# Patient Record
Sex: Female | Born: 1935 | Race: White | Hispanic: No | Marital: Married
Health system: Southern US, Community
[De-identification: ages and names within clinical notes are randomized; demographics above are authoritative.]

---

## 2011-12-23 ENCOUNTER — Ambulatory Visit: Payer: Self-pay | Admitting: Family Medicine

## 2012-02-04 ENCOUNTER — Encounter: Payer: Self-pay | Admitting: Internal Medicine

## 2012-02-18 ENCOUNTER — Encounter: Payer: Self-pay | Admitting: Internal Medicine

## 2012-03-18 ENCOUNTER — Ambulatory Visit: Payer: Self-pay | Admitting: Hematology and Oncology

## 2012-03-18 LAB — CBC CANCER CENTER
Basophil %: 0.1 %
Eosinophil #: 0 x10 3/mm (ref 0.0–0.7)
HCT: 30.9 % — ABNORMAL LOW (ref 35.0–47.0)
HGB: 9.4 g/dL — ABNORMAL LOW (ref 12.0–16.0)
Lymphocyte #: 0.5 x10 3/mm — ABNORMAL LOW (ref 1.0–3.6)
Lymphocyte %: 4.2 %
MCH: 22.9 pg — ABNORMAL LOW (ref 26.0–34.0)
MCHC: 30.5 g/dL — ABNORMAL LOW (ref 32.0–36.0)
Monocyte #: 0.1 x10 3/mm — ABNORMAL LOW (ref 0.2–0.9)
Neutrophil #: 10 x10 3/mm — ABNORMAL HIGH (ref 1.4–6.5)

## 2012-03-20 ENCOUNTER — Ambulatory Visit: Payer: Self-pay | Admitting: Hematology and Oncology

## 2012-03-23 ENCOUNTER — Encounter: Payer: Self-pay | Admitting: Internal Medicine

## 2012-03-25 LAB — BASIC METABOLIC PANEL
Anion Gap: 13 (ref 7–16)
BUN: 22 mg/dL — ABNORMAL HIGH (ref 7–18)
Calcium, Total: 9.3 mg/dL (ref 8.5–10.1)
Co2: 25 mmol/L (ref 21–32)
EGFR (African American): >60
EGFR (Non-African Amer.): 58 — ABNORMAL LOW
Osmolality: 275 (ref 275–301)
Sodium: 133 mmol/L — ABNORMAL LOW (ref 136–145)

## 2012-03-27 LAB — URINE IEP, RANDOM

## 2012-03-30 LAB — PROT IMMUNOELECTROPHORES(ARMC)

## 2012-04-01 LAB — CBC CANCER CENTER
Basophil %: 0.1 %
Eosinophil #: 0 x10 3/mm (ref 0.0–0.7)
Eosinophil %: 0 %
HCT: 36.2 % (ref 35.0–47.0)
HGB: 11.2 g/dL — ABNORMAL LOW (ref 12.0–16.0)
Lymphocyte %: 3.6 %
Monocyte %: 1.3 %
Neutrophil %: 95 %
WBC: 9.3 x10 3/mm (ref 3.6–11.0)

## 2012-04-01 LAB — IRON AND TIBC
Iron Bind.Cap.(Total): 344 ug/dL (ref 250–450)
Iron: 122 ug/dL (ref 50–170)

## 2012-04-01 LAB — FERRITIN: Ferritin (ARMC): 1062 ng/mL — ABNORMAL HIGH (ref 8–388)

## 2012-04-03 ENCOUNTER — Ambulatory Visit: Payer: Self-pay | Admitting: Hematology and Oncology

## 2012-04-09 ENCOUNTER — Ambulatory Visit: Payer: Self-pay | Admitting: Family Medicine

## 2012-04-19 ENCOUNTER — Encounter: Payer: Self-pay | Admitting: Internal Medicine

## 2012-04-19 ENCOUNTER — Ambulatory Visit: Payer: Self-pay | Admitting: Hematology and Oncology

## 2012-04-22 ENCOUNTER — Ambulatory Visit: Payer: Self-pay | Admitting: Gastroenterology

## 2012-04-23 LAB — PATHOLOGY REPORT

## 2012-04-30 ENCOUNTER — Ambulatory Visit: Payer: Self-pay | Admitting: Hematology and Oncology

## 2012-04-30 LAB — COMPREHENSIVE METABOLIC PANEL
Anion Gap: 8 (ref 7–16)
BUN: 21 mg/dL — ABNORMAL HIGH (ref 7–18)
Bilirubin,Total: 0.3 mg/dL (ref 0.2–1.0)
Calcium, Total: 9.8 mg/dL (ref 8.5–10.1)
Chloride: 100 mmol/L (ref 98–107)
EGFR (African American): >60
EGFR (Non-African Amer.): 57 — ABNORMAL LOW
Glucose: 100 mg/dL — ABNORMAL HIGH (ref 65–99)
SGOT(AST): 18 U/L (ref 15–37)
SGPT (ALT): 30 U/L (ref 12–78)
Sodium: 137 mmol/L (ref 136–145)
Total Protein: 6.6 g/dL (ref 6.4–8.2)

## 2012-04-30 LAB — CBC CANCER CENTER
Basophil #: 0 x10 3/mm (ref 0.0–0.1)
Basophil %: 0.3 %
Eosinophil %: 0.1 %
HCT: 39.6 % (ref 35.0–47.0)
HGB: 13.4 g/dL (ref 12.0–16.0)
Lymphocyte %: 3.8 %
MCHC: 33.7 g/dL (ref 32.0–36.0)
Monocyte %: 2.7 %
Neutrophil #: 13.4 x10 3/mm — ABNORMAL HIGH (ref 1.4–6.5)
RBC: 4.71 10*6/uL (ref 3.80–5.20)

## 2012-04-30 LAB — FERRITIN: Ferritin (ARMC): 732 ng/mL — ABNORMAL HIGH (ref 8–388)

## 2012-05-20 ENCOUNTER — Ambulatory Visit: Payer: Self-pay | Admitting: Hematology and Oncology

## 2012-05-22 ENCOUNTER — Encounter: Payer: Self-pay | Admitting: Internal Medicine

## 2012-05-28 ENCOUNTER — Ambulatory Visit: Payer: Self-pay | Admitting: Hematology and Oncology

## 2012-05-28 LAB — CBC CANCER CENTER
Basophil #: 0.1 x10 3/mm (ref 0.0–0.1)
Basophil %: 0.7 %
HCT: 38.4 % (ref 35.0–47.0)
HGB: 12.9 g/dL (ref 12.0–16.0)
Monocyte %: 8.6 %
Neutrophil #: 6.5 x10 3/mm (ref 1.4–6.5)
Neutrophil %: 76.7 %
Platelet: 223 x10 3/mm (ref 150–440)
RDW: 23.8 % — ABNORMAL HIGH (ref 11.5–14.5)
WBC: 8.5 x10 3/mm (ref 3.6–11.0)

## 2012-06-10 ENCOUNTER — Encounter: Payer: Self-pay | Admitting: Cardiothoracic Surgery

## 2012-06-10 ENCOUNTER — Encounter: Payer: Self-pay | Admitting: Nurse Practitioner

## 2012-06-11 ENCOUNTER — Encounter: Payer: Self-pay | Admitting: Nurse Practitioner

## 2012-06-11 ENCOUNTER — Encounter: Payer: Self-pay | Admitting: Cardiothoracic Surgery

## 2012-06-16 LAB — CBC CANCER CENTER
Basophil %: 0.4 %
Eosinophil #: 0 x10 3/mm (ref 0.0–0.7)
HCT: 40 % (ref 35.0–47.0)
Lymphocyte #: 0.8 x10 3/mm — ABNORMAL LOW (ref 1.0–3.6)
Lymphocyte %: 10 %
Monocyte #: 0.6 x10 3/mm (ref 0.2–0.9)
Monocyte %: 7.6 %
Neutrophil %: 81.6 %
RBC: 4.48 10*6/uL (ref 3.80–5.20)
WBC: 8.3 x10 3/mm (ref 3.6–11.0)

## 2012-06-20 ENCOUNTER — Ambulatory Visit: Payer: Self-pay | Admitting: Hematology and Oncology

## 2012-06-20 ENCOUNTER — Encounter: Payer: Self-pay | Admitting: Internal Medicine

## 2012-06-22 ENCOUNTER — Encounter: Payer: Self-pay | Admitting: Nurse Practitioner

## 2012-06-22 ENCOUNTER — Encounter: Payer: Self-pay | Admitting: Cardiothoracic Surgery

## 2012-07-17 ENCOUNTER — Ambulatory Visit: Payer: Self-pay | Admitting: Hematology and Oncology

## 2012-07-17 LAB — CBC CANCER CENTER
Basophil #: 0 x10 3/mm (ref 0.0–0.1)
Basophil %: 0.5 %
Eosinophil #: 0 x10 3/mm (ref 0.0–0.7)
HCT: 38.3 % (ref 35.0–47.0)
HGB: 12.9 g/dL (ref 12.0–16.0)
Lymphocyte #: 1 x10 3/mm (ref 1.0–3.6)
Lymphocyte %: 9.9 %
MCHC: 33.7 g/dL (ref 32.0–36.0)
MCV: 93 fL (ref 80–100)
Neutrophil #: 8 x10 3/mm — ABNORMAL HIGH (ref 1.4–6.5)
RBC: 4.13 10*6/uL (ref 3.80–5.20)
WBC: 9.8 x10 3/mm (ref 3.6–11.0)

## 2012-07-18 ENCOUNTER — Ambulatory Visit: Payer: Self-pay | Admitting: Hematology and Oncology

## 2012-08-12 ENCOUNTER — Ambulatory Visit: Payer: Self-pay | Admitting: Gastroenterology

## 2012-08-13 ENCOUNTER — Ambulatory Visit: Payer: Self-pay | Admitting: Hematology and Oncology

## 2012-08-13 LAB — CBC CANCER CENTER
Basophil %: 0.5 %
Eosinophil #: 0 x10 3/mm (ref 0.0–0.7)
HGB: 12.8 g/dL (ref 12.0–16.0)
MCHC: 34.4 g/dL (ref 32.0–36.0)
MCV: 93 fL (ref 80–100)
Monocyte #: 0.4 x10 3/mm (ref 0.2–0.9)
Monocyte %: 4.4 %
Platelet: 228 x10 3/mm (ref 150–440)
RBC: 3.98 10*6/uL (ref 3.80–5.20)
WBC: 8.4 x10 3/mm (ref 3.6–11.0)

## 2012-08-13 LAB — PATHOLOGY REPORT

## 2012-08-18 ENCOUNTER — Ambulatory Visit: Payer: Self-pay | Admitting: Hematology and Oncology

## 2012-09-09 LAB — CBC CANCER CENTER
Basophil %: 0.6 %
Eosinophil #: 0 x10 3/mm (ref 0.0–0.7)
HCT: 40.9 % (ref 35.0–47.0)
HGB: 13.5 g/dL (ref 12.0–16.0)
Lymphocyte #: 0.7 x10 3/mm — ABNORMAL LOW (ref 1.0–3.6)
Monocyte #: 0.4 x10 3/mm (ref 0.2–0.9)
Monocyte %: 5.1 %
Neutrophil #: 7.6 x10 3/mm — ABNORMAL HIGH (ref 1.4–6.5)
Platelet: 246 x10 3/mm (ref 150–440)
RBC: 4.36 10*6/uL (ref 3.80–5.20)
RDW: 15.4 % — ABNORMAL HIGH (ref 11.5–14.5)
WBC: 8.8 x10 3/mm (ref 3.6–11.0)

## 2012-09-10 ENCOUNTER — Encounter: Payer: Self-pay | Admitting: Cardiothoracic Surgery

## 2012-09-10 ENCOUNTER — Encounter: Payer: Self-pay | Admitting: Nurse Practitioner

## 2012-09-17 ENCOUNTER — Ambulatory Visit: Payer: Self-pay | Admitting: Hematology and Oncology

## 2012-10-14 LAB — CBC CANCER CENTER
Basophil %: 0.5 %
Eosinophil #: 0 x10 3/mm (ref 0.0–0.7)
Eosinophil %: 0.4 %
HGB: 14 g/dL (ref 12.0–16.0)
Lymphocyte #: 0.7 x10 3/mm — ABNORMAL LOW (ref 1.0–3.6)
MCHC: 34 g/dL (ref 32.0–36.0)
Monocyte #: 0.6 x10 3/mm (ref 0.2–0.9)
Monocyte %: 6.7 %
Neutrophil %: 84.5 %
Platelet: 218 x10 3/mm (ref 150–440)
RBC: 4.46 10*6/uL (ref 3.80–5.20)
RDW: 14.7 % — ABNORMAL HIGH (ref 11.5–14.5)
WBC: 8.6 x10 3/mm (ref 3.6–11.0)

## 2012-10-18 ENCOUNTER — Ambulatory Visit: Payer: Self-pay | Admitting: Hematology and Oncology

## 2012-11-13 LAB — CBC CANCER CENTER
Basophil #: 0.1 x10 3/mm (ref 0.0–0.1)
Eosinophil #: 0 x10 3/mm (ref 0.0–0.7)
Eosinophil %: 0.4 %
HCT: 39 % (ref 35.0–47.0)
HGB: 13.4 g/dL (ref 12.0–16.0)
MCH: 31.2 pg (ref 26.0–34.0)
MCV: 91 fL (ref 80–100)
Monocyte #: 0.7 x10 3/mm (ref 0.2–0.9)
Monocyte %: 8.6 %
Neutrophil %: 79.7 %
Platelet: 245 x10 3/mm (ref 150–440)
RBC: 4.28 10*6/uL (ref 3.80–5.20)
RDW: 14.2 % (ref 11.5–14.5)

## 2012-11-17 ENCOUNTER — Ambulatory Visit: Payer: Self-pay | Admitting: Hematology and Oncology

## 2012-12-14 LAB — CBC CANCER CENTER
Basophil %: 0.9 %
Eosinophil %: 0.4 %
HCT: 40.5 % (ref 35.0–47.0)
HGB: 13.9 g/dL (ref 12.0–16.0)
Lymphocyte %: 14.9 %
Monocyte #: 0.6 x10 3/mm (ref 0.2–0.9)
Monocyte %: 9.5 %
Platelet: 282 x10 3/mm (ref 150–440)
RBC: 4.39 10*6/uL (ref 3.80–5.20)
RDW: 15.6 % — ABNORMAL HIGH (ref 11.5–14.5)
WBC: 6.2 x10 3/mm (ref 3.6–11.0)

## 2012-12-18 ENCOUNTER — Ambulatory Visit: Payer: Self-pay | Admitting: Hematology and Oncology

## 2013-01-14 LAB — CBC CANCER CENTER
Basophil #: 0 x10 3/mm (ref 0.0–0.1)
Basophil %: 0.3 %
Eosinophil #: 0 x10 3/mm (ref 0.0–0.7)
Eosinophil %: 0.1 %
HGB: 12.9 g/dL (ref 12.0–16.0)
Lymphocyte %: 1.7 %
MCH: 32 pg (ref 26.0–34.0)
MCV: 92 fL (ref 80–100)
Monocyte #: 0.7 x10 3/mm (ref 0.2–0.9)
Monocyte %: 4 %
Neutrophil #: 16.2 x10 3/mm — ABNORMAL HIGH (ref 1.4–6.5)
Neutrophil %: 93.9 %
Platelet: 291 x10 3/mm (ref 150–440)

## 2013-01-14 LAB — CREATININE, SERUM
Creatinine: 0.87 mg/dL (ref 0.60–1.30)
EGFR (African American): >60
EGFR (Non-African Amer.): >60

## 2013-01-18 ENCOUNTER — Ambulatory Visit: Payer: Self-pay | Admitting: Hematology and Oncology

## 2013-02-04 ENCOUNTER — Ambulatory Visit: Payer: Self-pay | Admitting: Hematology and Oncology

## 2013-02-04 LAB — BASIC METABOLIC PANEL
Chloride: 98 mmol/L (ref 98–107)
Co2: 31 mmol/L (ref 21–32)
Creatinine: 0.95 mg/dL (ref 0.60–1.30)
EGFR (African American): >60
EGFR (Non-African Amer.): 58 — ABNORMAL LOW
Glucose: 99 mg/dL (ref 65–99)
Osmolality: 277 (ref 275–301)
Potassium: 3.3 mmol/L — ABNORMAL LOW (ref 3.5–5.1)
Sodium: 137 mmol/L (ref 136–145)

## 2013-02-04 LAB — CBC CANCER CENTER
Basophil #: 0 x10 3/mm (ref 0.0–0.1)
Basophil %: 0.3 %
Eosinophil #: 0 x10 3/mm (ref 0.0–0.7)
Eosinophil %: 0.3 %
HCT: 41.2 % (ref 35.0–47.0)
HGB: 13.9 g/dL (ref 12.0–16.0)
Lymphocyte #: 1.1 x10 3/mm (ref 1.0–3.6)
Lymphocyte %: 11.6 %
MCHC: 33.7 g/dL (ref 32.0–36.0)
MCV: 94 fL (ref 80–100)
Monocyte %: 5 %
RBC: 4.4 10*6/uL (ref 3.80–5.20)

## 2013-02-09 ENCOUNTER — Ambulatory Visit: Payer: Self-pay | Admitting: Vascular Surgery

## 2013-02-17 ENCOUNTER — Ambulatory Visit: Payer: Self-pay | Admitting: Hematology and Oncology

## 2013-02-25 LAB — CBC CANCER CENTER
Basophil #: 0 x10 3/mm (ref 0.0–0.1)
Eosinophil #: 0 x10 3/mm (ref 0.0–0.7)
Lymphocyte #: 0.8 x10 3/mm — ABNORMAL LOW (ref 1.0–3.6)
Lymphocyte %: 7.6 %
MCHC: 34 g/dL (ref 32.0–36.0)
Monocyte %: 2.4 %
Neutrophil %: 89.9 %
Platelet: 265 x10 3/mm (ref 150–440)
RBC: 4.35 10*6/uL (ref 3.80–5.20)
RDW: 15.6 % — ABNORMAL HIGH (ref 11.5–14.5)
WBC: 10 x10 3/mm (ref 3.6–11.0)

## 2013-02-25 LAB — BASIC METABOLIC PANEL
Anion Gap: 11 (ref 7–16)
BUN: 20 mg/dL — ABNORMAL HIGH (ref 7–18)
Chloride: 96 mmol/L — ABNORMAL LOW (ref 98–107)
EGFR (African American): >60
Glucose: 84 mg/dL (ref 65–99)
Osmolality: 274 (ref 275–301)
Sodium: 136 mmol/L (ref 136–145)

## 2013-03-16 LAB — CBC CANCER CENTER
Basophil %: 0.8 %
HCT: 40.8 % (ref 35.0–47.0)
Lymphocyte #: 0.9 x10 3/mm — ABNORMAL LOW (ref 1.0–3.6)
Lymphocyte %: 10.6 %
MCH: 31.8 pg (ref 26.0–34.0)
Monocyte #: 0.4 x10 3/mm (ref 0.2–0.9)
Monocyte %: 5.4 %
Neutrophil %: 82.9 %
RBC: 4.3 10*6/uL (ref 3.80–5.20)
RDW: 15.3 % — ABNORMAL HIGH (ref 11.5–14.5)
WBC: 8.3 x10 3/mm (ref 3.6–11.0)

## 2013-03-20 ENCOUNTER — Ambulatory Visit: Payer: Self-pay | Admitting: Hematology and Oncology

## 2013-04-08 LAB — CBC CANCER CENTER
Basophil %: 0.3 %
Eosinophil #: 0 x10 3/mm (ref 0.0–0.7)
Eosinophil %: 0.2 %
Lymphocyte #: 0.6 x10 3/mm — ABNORMAL LOW (ref 1.0–3.6)
MCH: 30.4 pg (ref 26.0–34.0)
MCHC: 32.7 g/dL (ref 32.0–36.0)
MCV: 93 fL (ref 80–100)
Monocyte #: 0.8 x10 3/mm (ref 0.2–0.9)
Monocyte %: 6.5 %
Neutrophil #: 11 x10 3/mm — ABNORMAL HIGH (ref 1.4–6.5)
Platelet: 277 x10 3/mm (ref 150–440)
RBC: 4.46 10*6/uL (ref 3.80–5.20)
RDW: 14.8 % — ABNORMAL HIGH (ref 11.5–14.5)
WBC: 12.5 x10 3/mm — ABNORMAL HIGH (ref 3.6–11.0)

## 2013-04-19 ENCOUNTER — Ambulatory Visit: Payer: Self-pay | Admitting: Hematology and Oncology

## 2013-04-29 LAB — CBC CANCER CENTER
Basophil %: 0.4 %
Eosinophil #: 0 x10 3/mm (ref 0.0–0.7)
Eosinophil %: 0.3 %
HGB: 12.7 g/dL (ref 12.0–16.0)
Lymphocyte #: 0.6 x10 3/mm — ABNORMAL LOW (ref 1.0–3.6)
MCHC: 33.9 g/dL (ref 32.0–36.0)
MCV: 90 fL (ref 80–100)
Monocyte #: 0.6 x10 3/mm (ref 0.2–0.9)
Neutrophil #: 9.6 x10 3/mm — ABNORMAL HIGH (ref 1.4–6.5)
Neutrophil %: 88.4 %
Platelet: 301 x10 3/mm (ref 150–440)
RBC: 4.17 10*6/uL (ref 3.80–5.20)
RDW: 14.5 % (ref 11.5–14.5)
WBC: 10.9 x10 3/mm (ref 3.6–11.0)

## 2013-05-19 IMAGING — MG MM CAD SCREENING MAMMO
1 series · 5 of 5 positions shown · non-contrast
Comparison: Mammograms from [employer] in [place] 12/28/2009,
02/08/2011.

REASON FOR EXAM: SCR MAMMO NO ORDER
COMMENTS:

PROCEDURE:     MAM - MAM DGTL SCRN MAM NO ORDER W/CAD  - April 09, 2012  [DATE]
RESULT:

[R CC · right · 5 of 5 slices shown]
[im 1/5]
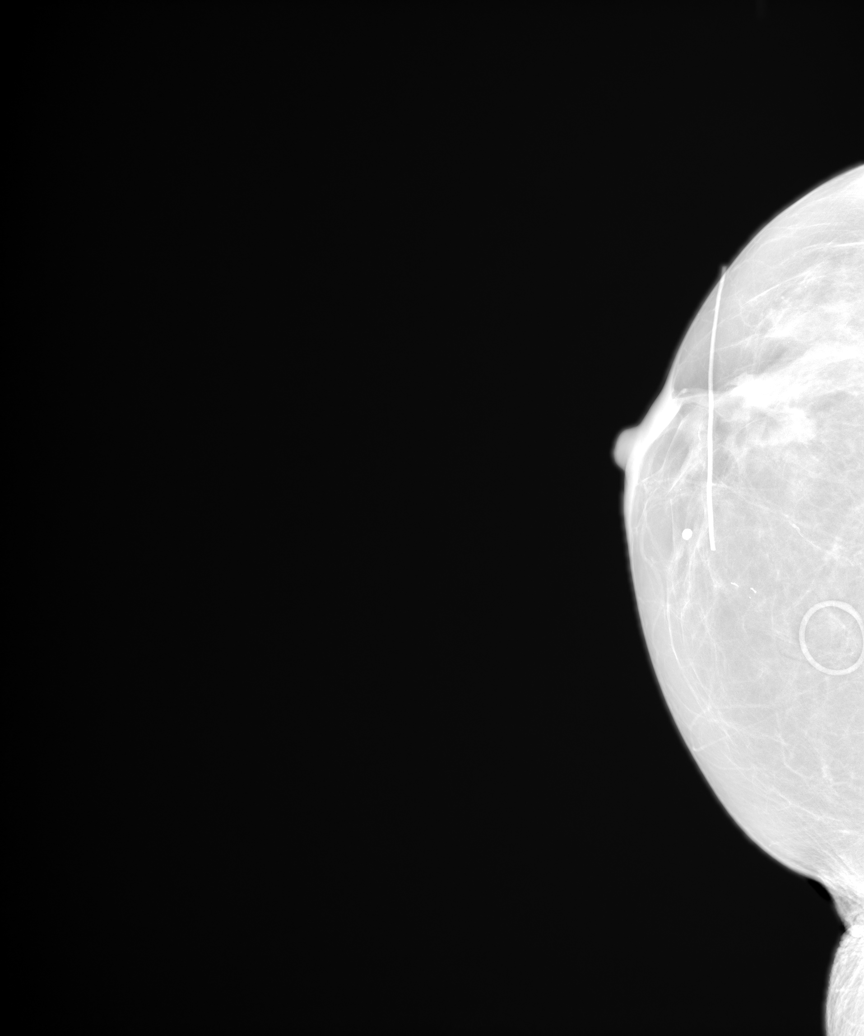
[im 2/5]
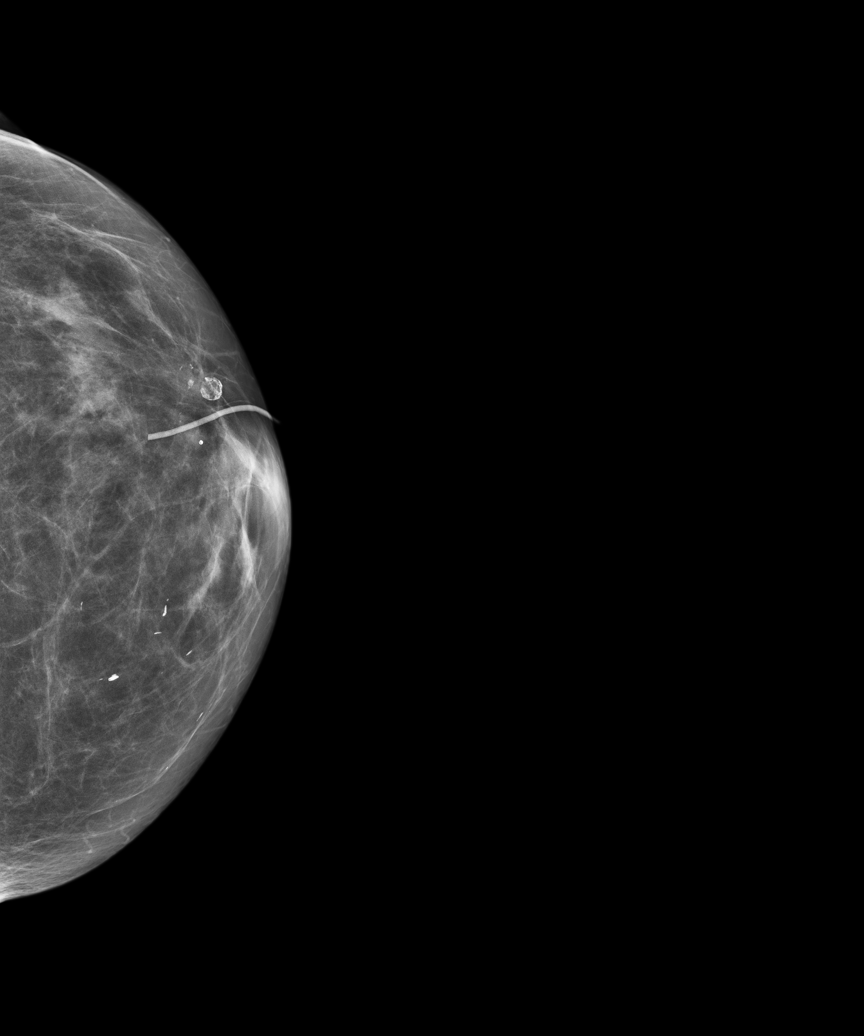
[im 3/5]
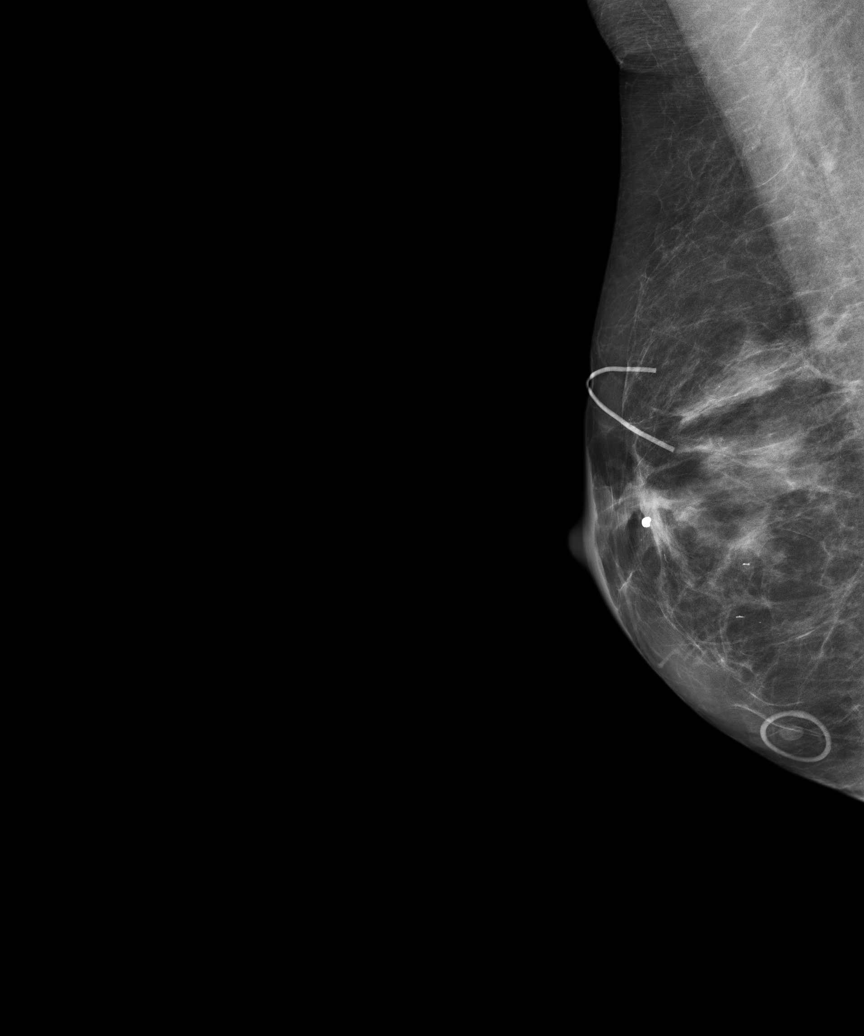
[im 4/5]
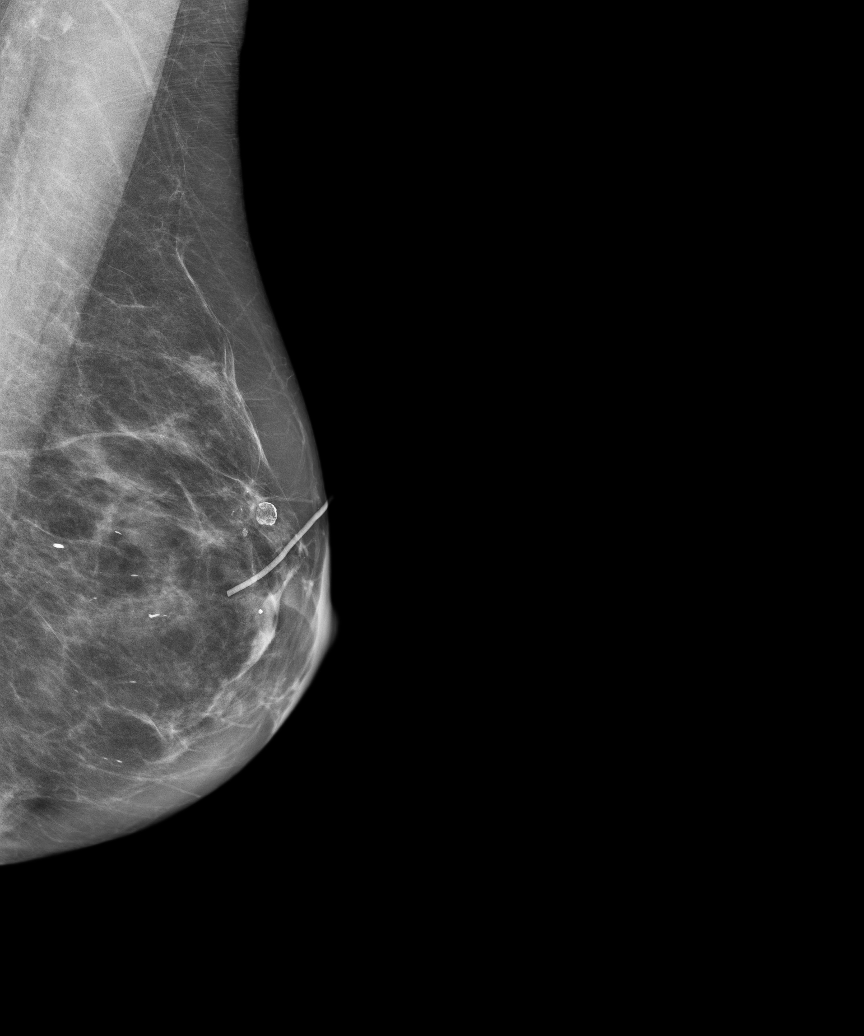
[im 5/5]
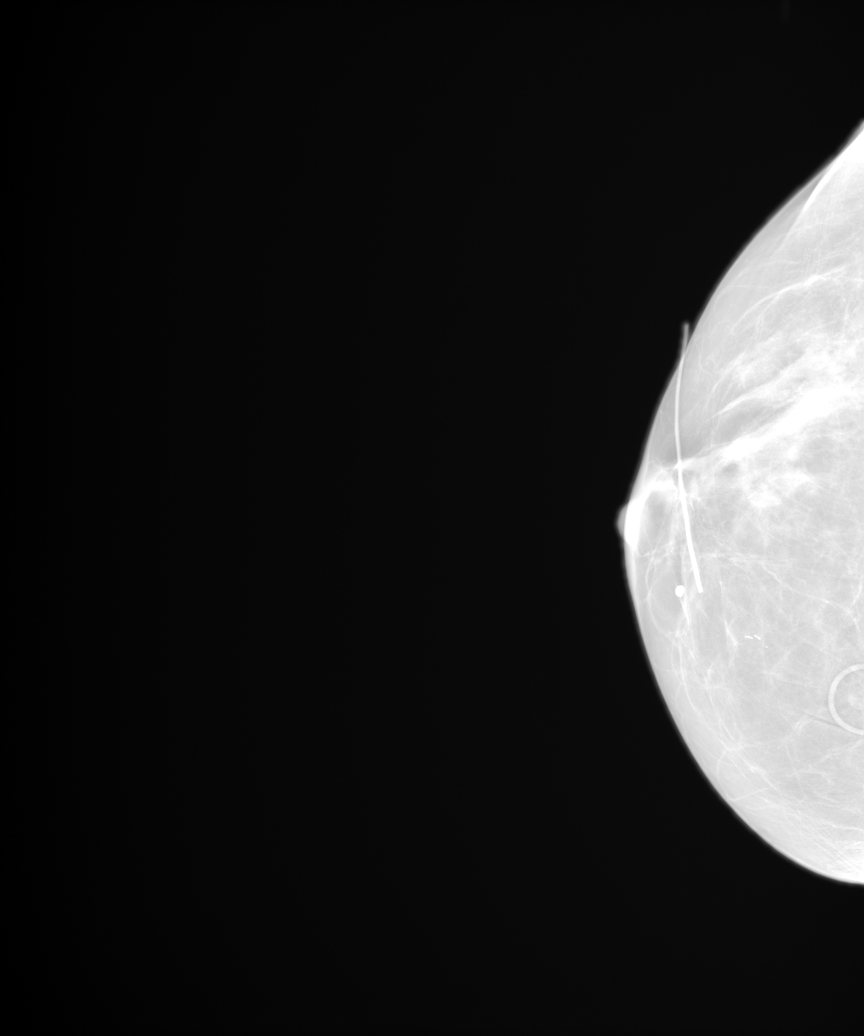

[5 of 5 positions shown; findings below may reference images not displayed]

FINDINGS: There is scattered fibroglandular tissue. A scar marker is present along the
superior lateral aspect of the right breast as well as the superolateral
aspect of the left breast. Mild architectural distortion and density deep to
the scar marker is similar to prior and likely related to post operative
changes. No suspicious masses or calcifications are identified. No areas of
architectural distortion.
IMPRESSION: 1.  BI-RADS: Category 2 - Benign Findings
2.  Recommend continued annual screening mammography.

Thank you for this opportunity to contribute to the care of your patient.

[REDACTED]

A NEGATIVE MAMMOGRAM REPORT DOES NOT PRECLUDE BIOPSY OR OTHER EVALUATION OF
A CLINICALLY PALPABLE OR OTHERWISE SUSPICIOUS MASS OR LESION. BREAST CANCER
MAY NOT BE DETECTED BY MAMMOGRAPHY IN UP TO 10% OF CASES.

## 2013-05-20 ENCOUNTER — Ambulatory Visit: Payer: Self-pay | Admitting: Hematology and Oncology

## 2013-05-21 LAB — CBC CANCER CENTER
Basophil #: 0 x10 3/mm (ref 0.0–0.1)
Basophil %: 0.1 %
Eosinophil #: 0 x10 3/mm (ref 0.0–0.7)
Eosinophil %: 0.1 %
HCT: 37.6 % (ref 35.0–47.0)
HGB: 12.3 g/dL (ref 12.0–16.0)
Lymphocyte #: 0.5 x10 3/mm — ABNORMAL LOW (ref 1.0–3.6)
Lymphocyte %: 3.6 %
MCH: 29.3 pg (ref 26.0–34.0)
MCHC: 32.6 g/dL (ref 32.0–36.0)
MCV: 90 fL (ref 80–100)
MONO ABS: 0.2 x10 3/mm (ref 0.2–0.9)
MONOS PCT: 1.5 %
NEUTROS PCT: 94.7 %
Neutrophil #: 14.2 x10 3/mm — ABNORMAL HIGH (ref 1.4–6.5)
Platelet: 347 x10 3/mm (ref 150–440)
RBC: 4.18 10*6/uL (ref 3.80–5.20)
RDW: 15 % — AB (ref 11.5–14.5)
WBC: 15 x10 3/mm — ABNORMAL HIGH (ref 3.6–11.0)

## 2013-06-20 ENCOUNTER — Ambulatory Visit: Payer: Self-pay | Admitting: Hematology and Oncology

## 2013-06-20 DEATH — deceased

## 2013-07-22 ENCOUNTER — Ambulatory Visit: Payer: Self-pay | Admitting: Hematology and Oncology

## 2014-03-21 IMAGING — XA IR VASCULAR PROCEDURE
1 series · 1 of 1 positions shown · non-contrast
Comparison: none

[Series 1: single · 1 of 1 slices shown]
[im 1/1]
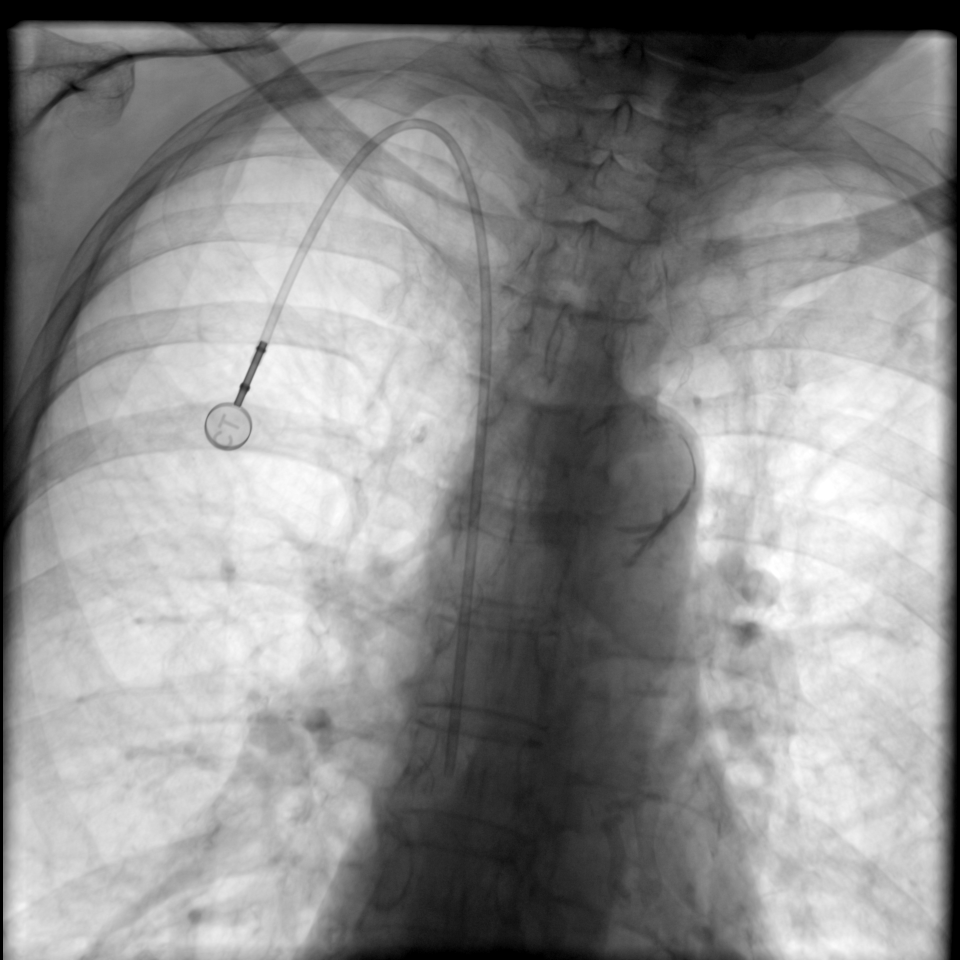

[1 of 1 positions shown; findings below may reference images not displayed]

IMAGES IMPORTED FROM THE [employer] WORKFLOW SYSTEM
NO DICTATION FOR STUDY

## 2014-09-09 NOTE — Op Note (Signed)
PATIENT NAME:  Angela Vaughn, [name] MR#:  [id number] DATE OF BIRTH:  April 29, 1934  DATE OF PROCEDURE:  02/09/2013  PREOPERATIVE DIAGNOSIS: Malignant melanoma.   POSTOPERATIVE DIAGNOSIS: Malignant melanoma.   PROCEDURE PERFORMED: Insertion of right IJ Infuse-a-Port with ultrasound and fluoroscopic guidance.   SURGEON: Renford DillsGregory G. Charbel Los, M.D.   SEDATION: Versed 4 mg plus fentanyl 150 mcg administered IV. Continuous ECG, pulse oximetry and cardiopulmonary monitoring was performed throughout the entire procedure by the interventional radiology nurse. Total sedation time was 45 minutes.   ACCESS: Right IJ.   FLUOROSCOPY TIME: Less than 1 minute.   CONTRAST USED: None.   INDICATIONS: Ms. Hazle NordmannHagwood is a 81 year old woman who is status post resection of malignant melanoma and is now undergoing IgG therapy. The risks and benefits for insertion of an Infuse-a-Port are reviewed. All questions answered. The patient agrees to proceed.   DESCRIPTION OF PROCEDURE: The patient is taken to special procedures and placed in the supine position and her right neck and chest wall are prepped and draped in sterile fashion. 1% lidocaine is infiltrated in the base of the neck as well as into the chest wall. Ultrasound is placed in a sterile sleeve.   Jugular vein is identified. It is echolucent, homogeneous and easily compressible indicating patency. Image is recorded for the permanent record and under real-time visualization Seldinger needle is inserted. J-wire is then advanced under fluoroscopic guidance into the inferior vena cava.   A small incision is created at the wire insertion site with an 11 blade scalpel and a pocket fashioned with blunt dissection. Linear incision is then created 2 fingerbreadths below the clavicle and a pocket fashioned using both blunt and sharp dissection for the port. The size of the pocket is tested with the hub and found to be adequate and the catheter is then connected to the tunneling  device and pulled subcutaneously from the pocket to the neck counterincision. Dilator and peel-away sheath is then inserted over the wire and the catheter is advanced after the wire and the dilator is removed. The peel-away sheath is then removed and under fluoroscopic guidance the catheter is positioned with its tip at the atriocaval junction. It is then transected and the hub is connected without difficulty and slipped into the subcutaneous pocket. The port is then accessed with a Huber needle percutaneously. It aspirates easily and flushes well. The incision is then closed using interrupted 3-0 Vicryl followed by 4-0 Monocryl subcuticular. The neck counterincision is closed with 4-0 Monocryl subcuticular. Dermabond is then applied to both incisions. The patient tolerated the procedure well. There were no immediate complications. Sponge and needle counts were correct, and she is taken to the recovery area in excellent condition. ____________________________ Renford DillsGregory G. Allyah Heather, MD ggs:sb D: 02/09/2013 15:05:51 ET T: 02/09/2013 15:17:42 ET JOB#: [id number]  cc: Renford DillsGregory G. Demarrion Meiklejohn, MD, <Dictator> Demetrius CharityVeshana S. Wendie Simmeramiah, MD Renford DillsGREGORY G Clary Boulais MD ELECTRONICALLY SIGNED 02/19/2013 10:02
